# Patient Record
Sex: Female | Born: 1950 | Race: White | Hispanic: No | Marital: Married | Smoking: Never smoker
Health system: Southern US, Community
[De-identification: ages and names within clinical notes are randomized; demographics above are authoritative.]

## PROBLEM LIST (undated history)

## (undated) DIAGNOSIS — D580 Hereditary spherocytosis: Secondary | ICD-10-CM

## (undated) DIAGNOSIS — M8580 Other specified disorders of bone density and structure, unspecified site: Secondary | ICD-10-CM

## (undated) DIAGNOSIS — R0789 Other chest pain: Secondary | ICD-10-CM

## (undated) DIAGNOSIS — E042 Nontoxic multinodular goiter: Secondary | ICD-10-CM

## (undated) HISTORY — PX: TUBAL LIGATION: SHX77

## (undated) HISTORY — PX: SPLENECTOMY: SUR1306

## (undated) HISTORY — PX: OTHER SURGICAL HISTORY: SHX169

---

## 2005-03-17 ENCOUNTER — Ambulatory Visit: Payer: Self-pay | Admitting: Family Medicine

## 2006-03-21 ENCOUNTER — Ambulatory Visit: Payer: Self-pay | Admitting: Family Medicine

## 2006-08-18 ENCOUNTER — Ambulatory Visit: Payer: Self-pay | Admitting: Internal Medicine

## 2006-09-01 ENCOUNTER — Ambulatory Visit: Payer: Self-pay | Admitting: Internal Medicine

## 2006-09-18 ENCOUNTER — Ambulatory Visit: Payer: Self-pay | Admitting: Family Medicine

## 2006-09-30 ENCOUNTER — Ambulatory Visit: Payer: Self-pay | Admitting: Internal Medicine

## 2006-10-17 ENCOUNTER — Ambulatory Visit: Payer: Self-pay | Admitting: General Surgery

## 2006-11-30 ENCOUNTER — Ambulatory Visit: Payer: Self-pay | Admitting: Internal Medicine

## 2006-12-31 ENCOUNTER — Ambulatory Visit: Payer: Self-pay | Admitting: Internal Medicine

## 2007-03-02 ENCOUNTER — Ambulatory Visit: Payer: Self-pay | Admitting: Internal Medicine

## 2007-03-08 ENCOUNTER — Ambulatory Visit: Payer: Self-pay | Admitting: Internal Medicine

## 2007-03-23 ENCOUNTER — Emergency Department: Payer: Self-pay | Admitting: Unknown Physician Specialty

## 2007-03-24 ENCOUNTER — Other Ambulatory Visit: Payer: Self-pay

## 2007-03-26 ENCOUNTER — Ambulatory Visit: Payer: Self-pay | Admitting: Family Medicine

## 2007-04-02 ENCOUNTER — Ambulatory Visit: Payer: Self-pay | Admitting: Internal Medicine

## 2007-09-13 ENCOUNTER — Ambulatory Visit: Payer: Self-pay | Admitting: Internal Medicine

## 2007-09-30 ENCOUNTER — Ambulatory Visit: Payer: Self-pay | Admitting: Internal Medicine

## 2008-03-01 ENCOUNTER — Ambulatory Visit: Payer: Self-pay | Admitting: Internal Medicine

## 2008-03-12 ENCOUNTER — Ambulatory Visit: Payer: Self-pay | Admitting: Internal Medicine

## 2008-03-26 ENCOUNTER — Ambulatory Visit: Payer: Self-pay | Admitting: Family Medicine

## 2008-04-01 ENCOUNTER — Ambulatory Visit: Payer: Self-pay | Admitting: Internal Medicine

## 2008-04-23 ENCOUNTER — Ambulatory Visit: Payer: Self-pay | Admitting: Unknown Physician Specialty

## 2009-03-01 ENCOUNTER — Ambulatory Visit: Payer: Self-pay | Admitting: Internal Medicine

## 2009-03-10 ENCOUNTER — Ambulatory Visit: Payer: Self-pay | Admitting: Internal Medicine

## 2009-03-31 ENCOUNTER — Ambulatory Visit: Payer: Self-pay | Admitting: Family Medicine

## 2009-04-01 ENCOUNTER — Ambulatory Visit: Payer: Self-pay | Admitting: Internal Medicine

## 2009-04-10 ENCOUNTER — Ambulatory Visit: Payer: Self-pay | Admitting: Family Medicine

## 2010-03-01 ENCOUNTER — Ambulatory Visit: Payer: Self-pay | Admitting: Internal Medicine

## 2010-03-05 ENCOUNTER — Ambulatory Visit: Payer: Self-pay | Admitting: Internal Medicine

## 2010-04-01 ENCOUNTER — Ambulatory Visit: Payer: Self-pay | Admitting: Internal Medicine

## 2010-04-06 ENCOUNTER — Ambulatory Visit: Payer: Self-pay | Admitting: Family Medicine

## 2011-03-02 ENCOUNTER — Ambulatory Visit: Payer: Self-pay | Admitting: Internal Medicine

## 2011-04-02 ENCOUNTER — Ambulatory Visit: Payer: Self-pay | Admitting: Internal Medicine

## 2011-05-11 ENCOUNTER — Ambulatory Visit: Payer: Self-pay | Admitting: Family Medicine

## 2011-05-13 ENCOUNTER — Ambulatory Visit: Payer: Self-pay | Admitting: Family Medicine

## 2012-05-14 ENCOUNTER — Ambulatory Visit: Payer: Self-pay | Admitting: Family Medicine

## 2013-05-15 ENCOUNTER — Ambulatory Visit: Payer: Self-pay | Admitting: Family Medicine

## 2014-05-19 ENCOUNTER — Ambulatory Visit: Payer: Self-pay | Admitting: Family Medicine

## 2014-11-04 ENCOUNTER — Emergency Department
Admit: 2014-11-04 | Disposition: A | Discharge status: Home / Self Care | Payer: Self-pay | Admitting: Emergency Medicine

## 2015-04-17 ENCOUNTER — Other Ambulatory Visit: Payer: Self-pay | Admitting: Family Medicine

## 2015-04-17 DIAGNOSIS — Z1231 Encounter for screening mammogram for malignant neoplasm of breast: Secondary | ICD-10-CM

## 2015-05-21 ENCOUNTER — Ambulatory Visit
Admission: RE | Admit: 2015-05-21 | Discharge: 2015-05-21 | Disposition: A | Discharge status: Home / Self Care | Payer: 59 | Source: Ambulatory Visit | Attending: Family Medicine | Admitting: Family Medicine

## 2015-05-21 DIAGNOSIS — Z1231 Encounter for screening mammogram for malignant neoplasm of breast: Secondary | ICD-10-CM | POA: Diagnosis not present

## 2016-04-19 ENCOUNTER — Other Ambulatory Visit: Payer: Self-pay | Admitting: Family Medicine

## 2016-04-19 DIAGNOSIS — Z1231 Encounter for screening mammogram for malignant neoplasm of breast: Secondary | ICD-10-CM

## 2016-05-25 ENCOUNTER — Ambulatory Visit
Admission: RE | Admit: 2016-05-25 | Discharge: 2016-05-25 | Disposition: A | Discharge status: Home / Self Care | Payer: 59 | Source: Ambulatory Visit | Attending: Family Medicine | Admitting: Family Medicine

## 2016-05-25 DIAGNOSIS — Z1231 Encounter for screening mammogram for malignant neoplasm of breast: Secondary | ICD-10-CM | POA: Diagnosis present

## 2017-04-21 ENCOUNTER — Other Ambulatory Visit: Payer: Self-pay | Admitting: Family Medicine

## 2017-04-21 DIAGNOSIS — Z1231 Encounter for screening mammogram for malignant neoplasm of breast: Secondary | ICD-10-CM

## 2017-05-30 ENCOUNTER — Ambulatory Visit
Admission: RE | Admit: 2017-05-30 | Discharge: 2017-05-30 | Disposition: A | Discharge status: Home / Self Care | Payer: Medicare HMO | Source: Ambulatory Visit | Attending: Family Medicine | Admitting: Family Medicine

## 2017-05-30 DIAGNOSIS — Z1231 Encounter for screening mammogram for malignant neoplasm of breast: Secondary | ICD-10-CM

## 2017-08-05 ENCOUNTER — Other Ambulatory Visit: Payer: Self-pay

## 2017-08-05 ENCOUNTER — Emergency Department
Admission: EM | Admit: 2017-08-05 | Discharge: 2017-08-05 | Disposition: A | Discharge status: Home / Self Care | Payer: Medicare HMO | Attending: Emergency Medicine | Admitting: Emergency Medicine

## 2017-08-05 ENCOUNTER — Emergency Department: Payer: Medicare HMO

## 2017-08-05 DIAGNOSIS — R079 Chest pain, unspecified: Secondary | ICD-10-CM | POA: Diagnosis present

## 2017-08-05 LAB — BASIC METABOLIC PANEL
Anion gap: 9 (ref 5–15)
BUN: 12 mg/dL (ref 6–20)
CALCIUM: 9.8 mg/dL (ref 8.9–10.3)
CHLORIDE: 107 mmol/L (ref 101–111)
CO2: 23 mmol/L (ref 22–32)
CREATININE: 0.66 mg/dL (ref 0.44–1.00)
Glucose, Bld: 106 mg/dL — ABNORMAL HIGH (ref 65–99)
Potassium: 3.8 mmol/L (ref 3.5–5.1)
SODIUM: 139 mmol/L (ref 135–145)

## 2017-08-05 LAB — CBC
HCT: 43 % (ref 35.0–47.0)
Hemoglobin: 15.2 g/dL (ref 12.0–16.0)
MCH: 32 pg (ref 26.0–34.0)
MCHC: 35.4 g/dL (ref 32.0–36.0)
MCV: 90.4 fL (ref 80.0–100.0)
PLATELETS: 411 10*3/uL (ref 150–440)
RBC: 4.75 MIL/uL (ref 3.80–5.20)
RDW: 13.2 % (ref 11.5–14.5)
WBC: 13.6 10*3/uL — AB (ref 3.6–11.0)

## 2017-08-05 LAB — TROPONIN I

## 2017-08-05 NOTE — Discharge Instructions (Signed)
You are evaluated for central chest discomfort, and although no certain cause was found, your exam and evaluation are overall reassuring in the emergency department today.  Return to the emergency room immediately for any new or worsening chest pain, certainly any nausea, sweats, vomiting, trouble breathing, pain into the neck/jaw or arms or back, dizziness or passing out, or any other symptoms concerning to you.  I am most suspicious of acid reflux/indigestion as we discussed, you may use Tums or Maalox over-the-counter to help neutralize acid, use as directed on labeling.  Try over-the-counter Prilosec 40 mg daily for 10-14 days to help reduce acid while your stomach lining is healing.

## 2017-08-05 NOTE — ED Triage Notes (Signed)
Pt reports that she developed indigestion at 0130, she took a Rolaid  It did not help, she also took Peptobismol and 81 mg of ASA. She did not feel better and called 911. EMS did a EKG and took two more baby ASA. She went to sleep and felt better she went to walk in clinic to get blood work and they sent her here.

## 2017-08-05 NOTE — ED Notes (Signed)
NAD noted at time of D/C. Pt denies questions or concerns. Pt ambulatory to the lobby at this time.  

## 2017-08-05 NOTE — ED Provider Notes (Signed)
[facility] Emergency Department Provider Note ____________________________________________   I have reviewed the triage vital signs and the triage nursing note.  HISTORY  Chief Complaint Chest Pain   Historian Patient  HPI Regina Melton is a 67 y.o. female with no significant contributing past medical history, presents for chest pain, central chest and lower chest that woke her up around 1:30 AM.  She states that she thought it might be indigestion and took Rolaids although she typically would take Tums.  She states she is only had indigestion a couple of times before.  No known coronary artery disease or cardiac history.  She is under a lot of stress right now because her 14 year old mother has been ill and the family has been staying overnight as well as during the day recently.  She did have an episode with some cold sweat when she started to worry about whether or not this could be something more serious.  She took a baby aspirin.  She did ultimately end up calling EMS on the request of her husband but it sounds like they told her that her EKG was normal and that she could decide whether or not to come to the ER now at that time, or wait till morning go to urgent care or the ER.  She chose to wait and go to urgent care in the morning and when she got there they told her to go to the ER for further evaluation.  She drove here herself.   No past medical history on file.  There are no active problems to display for this patient.   No past surgical history on file.  Prior to Admission medications   Not on File    No Known Allergies  Family History  Problem Relation Age of Onset  . Breast cancer Neg Hx     Social History Social History   Tobacco Use  . Smoking status: Not on file  Substance Use Topics  . Alcohol use: Not on file  . Drug use: Not on file    Review of Systems  Constitutional: Negative for fever. Eyes: Negative for  visual changes. ENT: Negative for sore throat. Cardiovascular: Positive for chest pain episode which was more significant overnight, currently mild soreness or tightness. Respiratory: Negative for shortness of breath. Gastrointestinal: Negative for abdominal pain, vomiting and diarrhea. Genitourinary: Negative for dysuria. Musculoskeletal: Negative for back pain. Skin: Negative for rash. Neurological: Negative for headache.  ____________________________________________   PHYSICAL EXAM:  VITAL SIGNS: ED Triage Vitals  Enc Vitals Group     BP 08/05/17 1003 (!) 154/82     Pulse Rate 08/05/17 1003 81     Resp 08/05/17 1003 18     Temp 08/05/17 1003 98.1 F (36.7 C)     Temp Source 08/05/17 1003 Oral     SpO2 08/05/17 1003 100 %     Weight 08/05/17 1001 140 lb (63.5 kg)     Height 08/05/17 1001 5' 2.5" (1.588 m)     Head Circumference --      Peak Flow --      Pain Score 08/05/17 1000 0     Pain Loc --      Pain Edu? --      Excl. in [place]? --      Constitutional: Alert and oriented. Well appearing and in no distress. HEENT   Head: Normocephalic and atraumatic.      Eyes: Conjunctivae are normal. Pupils equal and round.  Ears:         Nose: No congestion/rhinnorhea.   Mouth/Throat: Mucous membranes are moist.   Neck: No stridor. Cardiovascular/Chest: Normal rate, regular rhythm.  No murmurs, rubs, or gallops. Respiratory: Normal respiratory effort without tachypnea nor retractions. Breath sounds are clear and equal bilaterally. No wheezes/rales/rhonchi. Gastrointestinal: Soft. No distention, no guarding, no rebound. Nontender.    Genitourinary/rectal:Deferred Musculoskeletal: Nontender with normal range of motion in all extremities. No joint effusions.  No lower extremity tenderness.  No edema. Neurologic:  Normal speech and language. No gross or focal neurologic deficits are appreciated. Skin:  Skin is warm, dry and intact. No rash noted. Psychiatric: Mood  and affect are normal. Speech and behavior are normal. Patient exhibits appropriate insight and judgment.   ____________________________________________  LABS (pertinent positives/negatives) I, Lisa Roca, MD the attending physician have reviewed the labs noted below.  Labs Reviewed  BASIC METABOLIC PANEL - Abnormal; Notable for the following components:      Result Value   Glucose, Bld 106 (*)    All other components within normal limits  CBC - Abnormal; Notable for the following components:   WBC 13.6 (*)    All other components within normal limits  TROPONIN I    ____________________________________________    EKG I, Lisa Roca, MD, the attending physician have personally viewed and interpreted all ECGs.  89 bpm.  Normal sinus rhythm.  Narrow QRS.  Normal axis.  Normal ST and T wave ____________________________________________  RADIOLOGY All Xrays were viewed by me.  Imaging interpreted by Radiologist, and I, Lisa Roca, MD the attending physician have reviewed the radiologist interpretation noted below.  Chest x-ray two-view:  IMPRESSION: No evidence of acute cardiopulmonary disease.  __________________________________________  PROCEDURES  Procedure(s) performed: None  Critical Care performed: None   ____________________________________________  ED COURSE / ASSESSMENT AND PLAN  Pertinent labs & imaging results that were available during my care of the patient were reviewed by me and considered in my medical decision making (see chart for details).    Patient describes central chest discomfort at one point with some sweats, but no other associated symptoms which is currently now essentially resolved.  Pain at onset at 130, and patient finally ended up over here and EKG is normal in appearance, troponin is normal.  Symptoms were well over 4 hours ago, and not persistent, I do not think she needs an additional troponin recheck today.  Symptoms seem most  likely clinically consistent with GERD/gastritis/indigestion.  We discussed symptomatic treatment for that.  I am asking her to follow closely with a cardiologist for consideration of stress testing.  We discussed return precautions with respect to any worsening or changing condition.  DIFFERENTIAL DIAGNOSIS: Differential diagnosis includes, but is not limited to, ACS, aortic dissection, pulmonary embolism, cardiac tamponade, pneumothorax, pneumonia, pericarditis, myocarditis, GI-related causes including esophagitis/gastritis, and musculoskeletal chest wall pain.    CONSULTATIONS:  None   Patient / Family / Caregiver informed of clinical course, medical decision-making process, and agree with plan.   I discussed return precautions, follow-up instructions, and discharge instructions with patient and/or family.  Discharge Instructions :  You are evaluated for central chest discomfort, and although no certain cause was found, your exam and evaluation are overall reassuring in the emergency department today.  Return to the emergency room immediately for any new or worsening chest pain, certainly any nausea, sweats, vomiting, trouble breathing, pain into the neck/jaw or arms or back, dizziness or passing out, or any other symptoms concerning  to you.  I am most suspicious of acid reflux/indigestion as we discussed, you may use Tums or Maalox over-the-counter to help neutralize acid, use as directed on labeling.  Try over-the-counter Prilosec 40 mg daily for 10-14 days to help reduce acid while your stomach lining is healing.      ___________________________________________   FINAL CLINICAL IMPRESSION(S) / ED DIAGNOSES   Final diagnoses:  Nonspecific chest pain      ___________________________________________        Note: This dictation was prepared with Dragon dictation. Any transcriptional errors that result from this process are unintentional    Lisa Roca, MD 08/05/17  1105

## 2018-04-25 ENCOUNTER — Other Ambulatory Visit: Payer: Self-pay | Admitting: Family Medicine

## 2018-04-25 DIAGNOSIS — Z1231 Encounter for screening mammogram for malignant neoplasm of breast: Secondary | ICD-10-CM

## 2018-05-31 ENCOUNTER — Ambulatory Visit
Admission: RE | Admit: 2018-05-31 | Discharge: 2018-05-31 | Disposition: A | Discharge status: Home / Self Care | Payer: Medicare HMO | Source: Ambulatory Visit | Attending: Family Medicine | Admitting: Family Medicine

## 2018-05-31 DIAGNOSIS — Z1231 Encounter for screening mammogram for malignant neoplasm of breast: Secondary | ICD-10-CM | POA: Insufficient documentation

## 2018-06-04 ENCOUNTER — Other Ambulatory Visit: Payer: Self-pay | Admitting: Family Medicine

## 2018-06-04 DIAGNOSIS — R928 Other abnormal and inconclusive findings on diagnostic imaging of breast: Secondary | ICD-10-CM

## 2018-06-04 DIAGNOSIS — N6310 Unspecified lump in the right breast, unspecified quadrant: Secondary | ICD-10-CM

## 2018-06-14 ENCOUNTER — Ambulatory Visit
Admission: RE | Admit: 2018-06-14 | Discharge: 2018-06-14 | Disposition: A | Discharge status: Home / Self Care | Payer: Medicare HMO | Source: Ambulatory Visit | Attending: Family Medicine | Admitting: Family Medicine

## 2018-06-14 DIAGNOSIS — R928 Other abnormal and inconclusive findings on diagnostic imaging of breast: Secondary | ICD-10-CM

## 2018-06-14 DIAGNOSIS — N6310 Unspecified lump in the right breast, unspecified quadrant: Secondary | ICD-10-CM | POA: Insufficient documentation

## 2018-07-23 ENCOUNTER — Encounter: Payer: Self-pay | Admitting: *Deleted

## 2018-07-27 ENCOUNTER — Ambulatory Visit: Payer: Medicare HMO | Admitting: Anesthesiology

## 2018-07-27 ENCOUNTER — Other Ambulatory Visit: Payer: Self-pay

## 2018-07-27 ENCOUNTER — Encounter
Admission: RE | Disposition: A | Discharge status: Home / Self Care | Payer: Self-pay | Source: Ambulatory Visit | Attending: Unknown Physician Specialty

## 2018-07-27 ENCOUNTER — Ambulatory Visit
Admission: RE | Admit: 2018-07-27 | Discharge: 2018-07-27 | Disposition: A | Discharge status: Home / Self Care | Payer: Medicare HMO | Source: Ambulatory Visit | Attending: Unknown Physician Specialty | Admitting: Unknown Physician Specialty

## 2018-07-27 ENCOUNTER — Encounter: Payer: Self-pay | Admitting: Anesthesiology

## 2018-07-27 DIAGNOSIS — K640 First degree hemorrhoids: Secondary | ICD-10-CM | POA: Diagnosis not present

## 2018-07-27 DIAGNOSIS — D580 Hereditary spherocytosis: Secondary | ICD-10-CM | POA: Insufficient documentation

## 2018-07-27 DIAGNOSIS — Z79899 Other long term (current) drug therapy: Secondary | ICD-10-CM | POA: Diagnosis not present

## 2018-07-27 DIAGNOSIS — M8580 Other specified disorders of bone density and structure, unspecified site: Secondary | ICD-10-CM | POA: Diagnosis not present

## 2018-07-27 DIAGNOSIS — R195 Other fecal abnormalities: Secondary | ICD-10-CM | POA: Diagnosis not present

## 2018-07-27 DIAGNOSIS — D123 Benign neoplasm of transverse colon: Secondary | ICD-10-CM | POA: Insufficient documentation

## 2018-07-27 HISTORY — DX: Nontoxic multinodular goiter: E04.2

## 2018-07-27 HISTORY — DX: Other chest pain: R07.89

## 2018-07-27 HISTORY — DX: Hereditary spherocytosis: D58.0

## 2018-07-27 HISTORY — PX: COLONOSCOPY WITH PROPOFOL: SHX5780

## 2018-07-27 HISTORY — DX: Other specified disorders of bone density and structure, unspecified site: M85.80

## 2018-07-27 SURGERY — COLONOSCOPY WITH PROPOFOL
Anesthesia: General

## 2018-07-27 MED ORDER — PROPOFOL 500 MG/50ML IV EMUL
INTRAVENOUS | Status: AC
  Filled 2018-07-27: qty 50

## 2018-07-27 MED ORDER — LIDOCAINE HCL (PF) 2 % IJ SOLN
INTRAMUSCULAR | Status: AC
  Filled 2018-07-27: qty 10

## 2018-07-27 MED ORDER — PROPOFOL 500 MG/50ML IV EMUL
INTRAVENOUS | Status: DC | PRN
  Administered 2018-07-27: 175 ug/kg/min via INTRAVENOUS

## 2018-07-27 MED ORDER — SODIUM CHLORIDE 0.9 % IV SOLN
INTRAVENOUS | Status: DC
  Administered 2018-07-27: 10:00:00 via INTRAVENOUS

## 2018-07-27 MED ORDER — LIDOCAINE HCL (CARDIAC) PF 100 MG/5ML IV SOSY
PREFILLED_SYRINGE | INTRAVENOUS | Status: DC | PRN
  Administered 2018-07-27: 50 mg via INTRAVENOUS

## 2018-07-27 MED ORDER — SODIUM CHLORIDE 0.9 % IV SOLN: INTRAVENOUS | Status: DC

## 2018-07-27 MED ORDER — PROPOFOL 10 MG/ML IV BOLUS
INTRAVENOUS | Status: DC | PRN
  Administered 2018-07-27: 40 mg via INTRAVENOUS
  Administered 2018-07-27: 50 mg via INTRAVENOUS

## 2018-07-27 NOTE — Anesthesia Postprocedure Evaluation (Signed)
Anesthesia Post Note  Patient: Regina Melton  Procedure(s) Performed: COLONOSCOPY WITH PROPOFOL (N/A )  Patient location during evaluation: Endoscopy Anesthesia Type: General Level of consciousness: awake and alert Pain management: pain level controlled Vital Signs Assessment: post-procedure vital signs reviewed and stable Respiratory status: spontaneous breathing, nonlabored ventilation, respiratory function stable and patient connected to nasal cannula oxygen Cardiovascular status: blood pressure returned to baseline and stable Postop Assessment: no apparent nausea or vomiting Anesthetic complications: no     Last Vitals:  Vitals:   07/27/18 1220 07/27/18 1222  BP: 104/86 104/86  Pulse: 64 65  Resp: 16 (!) 21  Temp:    SpO2: 100% 100%    Last Pain:  Vitals:   07/27/18 1222  TempSrc:   PainSc: 0-No pain                 Abiola Behring S

## 2018-07-27 NOTE — Op Note (Signed)
[facility]astroenterology Patient Name: Regina Melton Procedure Date: 07/27/2018 11:22 AM MRN: [id number] Account #: [id number] Date of Birth: 09-04-1950 Admit Type: Outpatient Age: 67 Room: [facility] ENDO ROOM 3 Gender: Female Note Status: Finalized Procedure:            Colonoscopy Indications:          Heme positive stool Providers:            Manya Silvas, MD Referring MD:         Irven Easterly. Kary Kos, MD (Referring MD) Medicines:            Propofol per Anesthesia Complications:        No immediate complications. Procedure:            Pre-Anesthesia Assessment:                       - After reviewing the risks and benefits, the patient                        was deemed in satisfactory condition to undergo the                        procedure.                       After obtaining informed consent, the colonoscope was                        passed under direct vision. Throughout the procedure,                        the patient's blood pressure, pulse, and oxygen                        saturations were monitored continuously. The                        Colonoscope was introduced through the anus and                        advanced to the the cecum, identified by appendiceal                        orifice and ileocecal valve. The colonoscopy was                        performed without difficulty. The patient tolerated the                        procedure well. The quality of the bowel preparation                        was excellent. Findings:      A diminutive polyp was found in the transverse colon. The polyp was       sessile. The polyp was removed with a jumbo cold forceps. Resection and       retrieval were complete.      Internal hemorrhoids were found during endoscopy. The hemorrhoids were       small and Grade I (internal hemorrhoids that do not prolapse).      The exam was otherwise  without abnormality. Impression:           - One diminutive polyp  in the transverse colon, removed                        with a jumbo cold forceps. Resected and retrieved.                       - Internal hemorrhoids.                       - The examination was otherwise normal. Recommendation:       - Await pathology results. Manya Silvas, MD 07/27/2018 12:00:25 PM This report has been signed electronically. Number of Addenda: 0 Note Initiated On: 07/27/2018 11:22 AM Scope Withdrawal Time: 0 hours 7 minutes 30 seconds  Total Procedure Duration: 0 hours 14 minutes 56 seconds       [facility]

## 2018-07-27 NOTE — H&P (Signed)
Primary Care Physician:  [place] Pink, MD Primary Gastroenterologist:  Dr. Vira Agar  Pre-Procedure History & Physical: HPI:  Regina Melton is a 67 y.o. female is here for an colonoscopy.   Past Medical History:  Diagnosis Date  . Atypical chest pain   . Multinodular goiter   . Osteopenia   . Spherocytosis (familial) [facility])     Past Surgical History:  Procedure Laterality Date  . SPLENECTOMY     AGE 9  . THYROID NODULE BIOPSY Right   . TUBAL LIGATION      Prior to Admission medications   Medication Sig Start Date End Date Taking? Authorizing Provider  calcium-vitamin D (OSCAL WITH D) 500-200 MG-UNIT tablet Take 1 tablet by mouth 2 (two) times daily.   Yes [provider]  omeprazole (PRILOSEC) 20 MG capsule Take 20 mg by mouth 2 (two) times daily before a meal.   Yes [provider]  vitamin B-12 (CYANOCOBALAMIN) 500 MCG tablet Take 500 mcg by mouth daily.   Yes [provider]    Allergies as of 05/17/2018  . (No Known Allergies)    Family History  Problem Relation Age of Onset  . Breast cancer Neg Hx     Social History   Socioeconomic History  . Marital status: Married    Spouse name: Not on file  . Number of children: Not on file  . Years of education: Not on file  . Highest education level: Not on file  Occupational History  . Not on file  Social Needs  . Financial resource strain: Not on file  . Food insecurity:    Worry: Not on file    Inability: Not on file  . Transportation needs:    Medical: Not on file    Non-medical: Not on file  Tobacco Use  . Smoking status: Never Smoker  . Smokeless tobacco: Never Used  Substance and Sexual Activity  . Alcohol use: Never    Frequency: Never  . Drug use: Never  . Sexual activity: Not on file  Lifestyle  . Physical activity:    Days per week: Not on file    Minutes per session: Not on file  . Stress: Not on file  Relationships  . Social connections:    Talks on phone:  Not on file    Gets together: Not on file    Attends religious service: Not on file    Active member of club or organization: Not on file    Attends meetings of clubs or organizations: Not on file    Relationship status: Not on file  . Intimate partner violence:    Fear of current or ex partner: Not on file    Emotionally abused: Not on file    Physically abused: Not on file    Forced sexual activity: Not on file  Other Topics Concern  . Not on file  Social History Narrative  . Not on file    Review of Systems: See HPI, otherwise negative ROS  Physical Exam: BP (!) 145/82   Pulse (!) 16   Temp (!) 96.8 F (36 C) (Tympanic)   Resp 16   Ht 5\' 1"  (1.549 m)   Wt 59.9 kg   SpO2 100%   BMI 24.94 kg/m  General:   Alert,  pleasant and cooperative in NAD Head:  Normocephalic and atraumatic. Neck:  Supple; no masses or thyromegaly. Lungs:  Clear throughout to auscultation.    Heart:  Regular rate and  rhythm. Abdomen:  Soft, nontender and nondistended. Normal bowel sounds, without guarding, and without rebound.   Neurologic:  Alert and  oriented x4;  grossly normal neurologically.  Impression/Plan: Regina Melton is here for an colonoscopy to be performed for heme positive stool.  Risks, benefits, limitations, and alternatives regarding  colonoscopy have been reviewed with the patient.  Questions have been answered.  All parties agreeable.   Gaylyn Cheers, MD  07/27/2018, 11:27 AM

## 2018-07-27 NOTE — Anesthesia Post-op Follow-up Note (Signed)
Anesthesia QCDR form completed.        

## 2018-07-27 NOTE — Transfer of Care (Signed)
Immediate Anesthesia Transfer of Care Note  Patient: Regina Melton  Procedure(s) Performed: COLONOSCOPY WITH PROPOFOL (N/A )  Patient Location: PACU  Anesthesia Type:General  Level of Consciousness: awake  Airway & Oxygen Therapy: Patient Spontanous Breathing and Patient connected to nasal cannula oxygen  Post-op Assessment: Report given to RN and Post -op Vital signs reviewed and stable  Post vital signs: Reviewed and stable  Last Vitals:  Vitals Value Taken Time  BP 99/55 07/27/2018 11:52 AM  Temp 36.1 C 07/27/2018 11:51 AM  Pulse 82 07/27/2018 11:52 AM  Resp 21 07/27/2018 11:52 AM  SpO2 98 % 07/27/2018 11:52 AM  Vitals shown include unvalidated device data.  Last Pain:  Vitals:   07/27/18 1151  TempSrc: Tympanic  PainSc: 0-No pain         Complications: No apparent anesthesia complications

## 2018-07-27 NOTE — Anesthesia Preprocedure Evaluation (Signed)
Anesthesia Evaluation  Patient identified by MRN, date of birth, ID band Patient awake    Reviewed: Allergy & Precautions, NPO status , Patient's Chart, lab work & pertinent test results, reviewed documented beta blocker date and time   Airway Mallampati: II  TM Distance: >3 FB     Dental  (+) Chipped   Pulmonary           Cardiovascular      Neuro/Psych    GI/Hepatic   Endo/Other    Renal/GU      Musculoskeletal   Abdominal   Peds  Hematology  (+) anemia ,   Anesthesia Other Findings EKG ok.  Reproductive/Obstetrics                             Anesthesia Physical Anesthesia Plan  ASA: II  Anesthesia Plan: General   Post-op Pain Management:    Induction: Intravenous  PONV Risk Score and Plan:   Airway Management Planned:   Additional Equipment:   Intra-op Plan:   Post-operative Plan:   Informed Consent: I have reviewed the patients History and Physical, chart, labs and discussed the procedure including the risks, benefits and alternatives for the proposed anesthesia with the patient or authorized representative who has indicated his/her understanding and acceptance.     Plan Discussed with: CRNA  Anesthesia Plan Comments:         Anesthesia Quick Evaluation

## 2018-07-28 NOTE — Progress Notes (Signed)
Voicemail. No message left.

## 2018-07-30 ENCOUNTER — Encounter: Payer: Self-pay | Admitting: Unknown Physician Specialty

## 2018-07-30 LAB — SURGICAL PATHOLOGY

## 2018-10-31 IMAGING — CR DG CHEST 2V
1 series · 2 of 2 positions shown · non-contrast
Comparison: 11/04/2014

CLINICAL DATA: Chest pain

EXAM:
CHEST  2 VIEW

[Series 1: dg chest 2 view · 0.14mm/px · 2 of 2 slices shown]
[im 1/2]
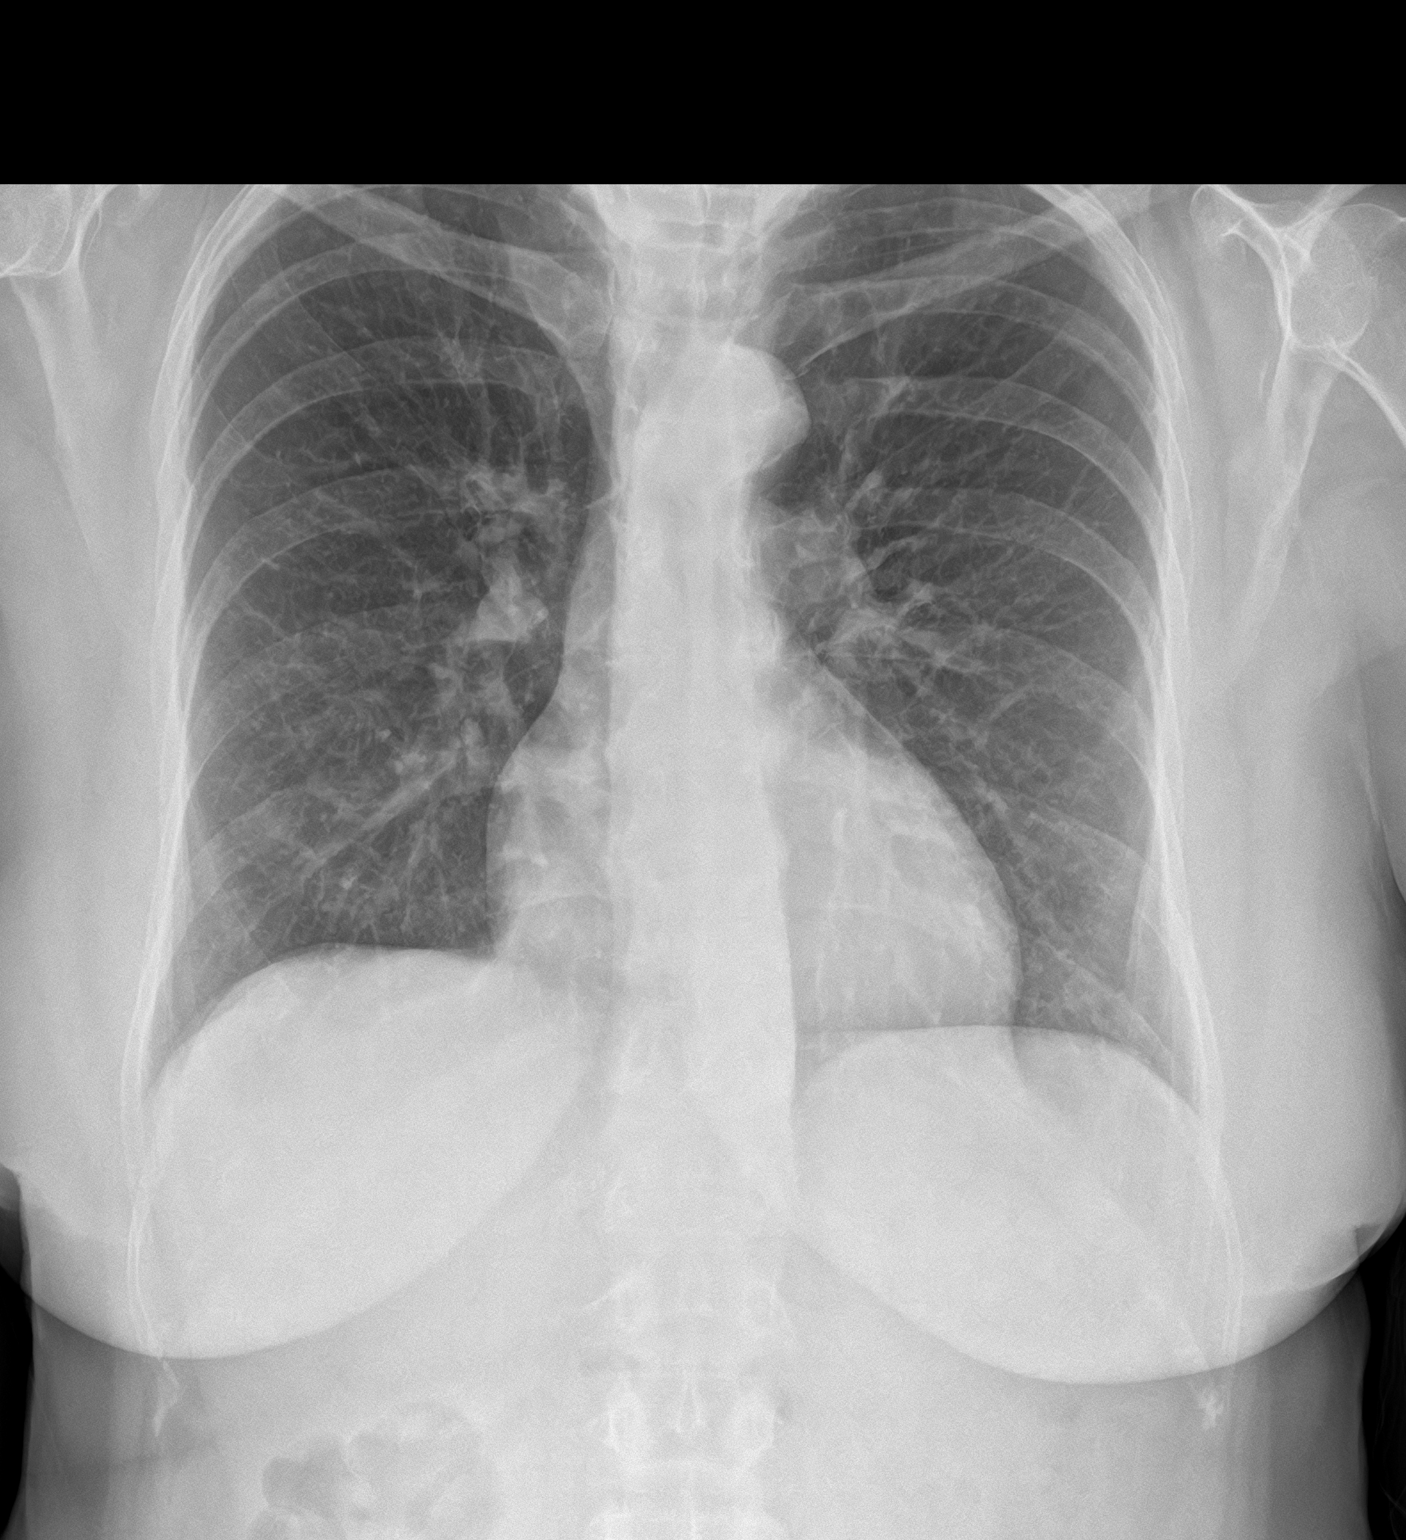
[im 2/2]
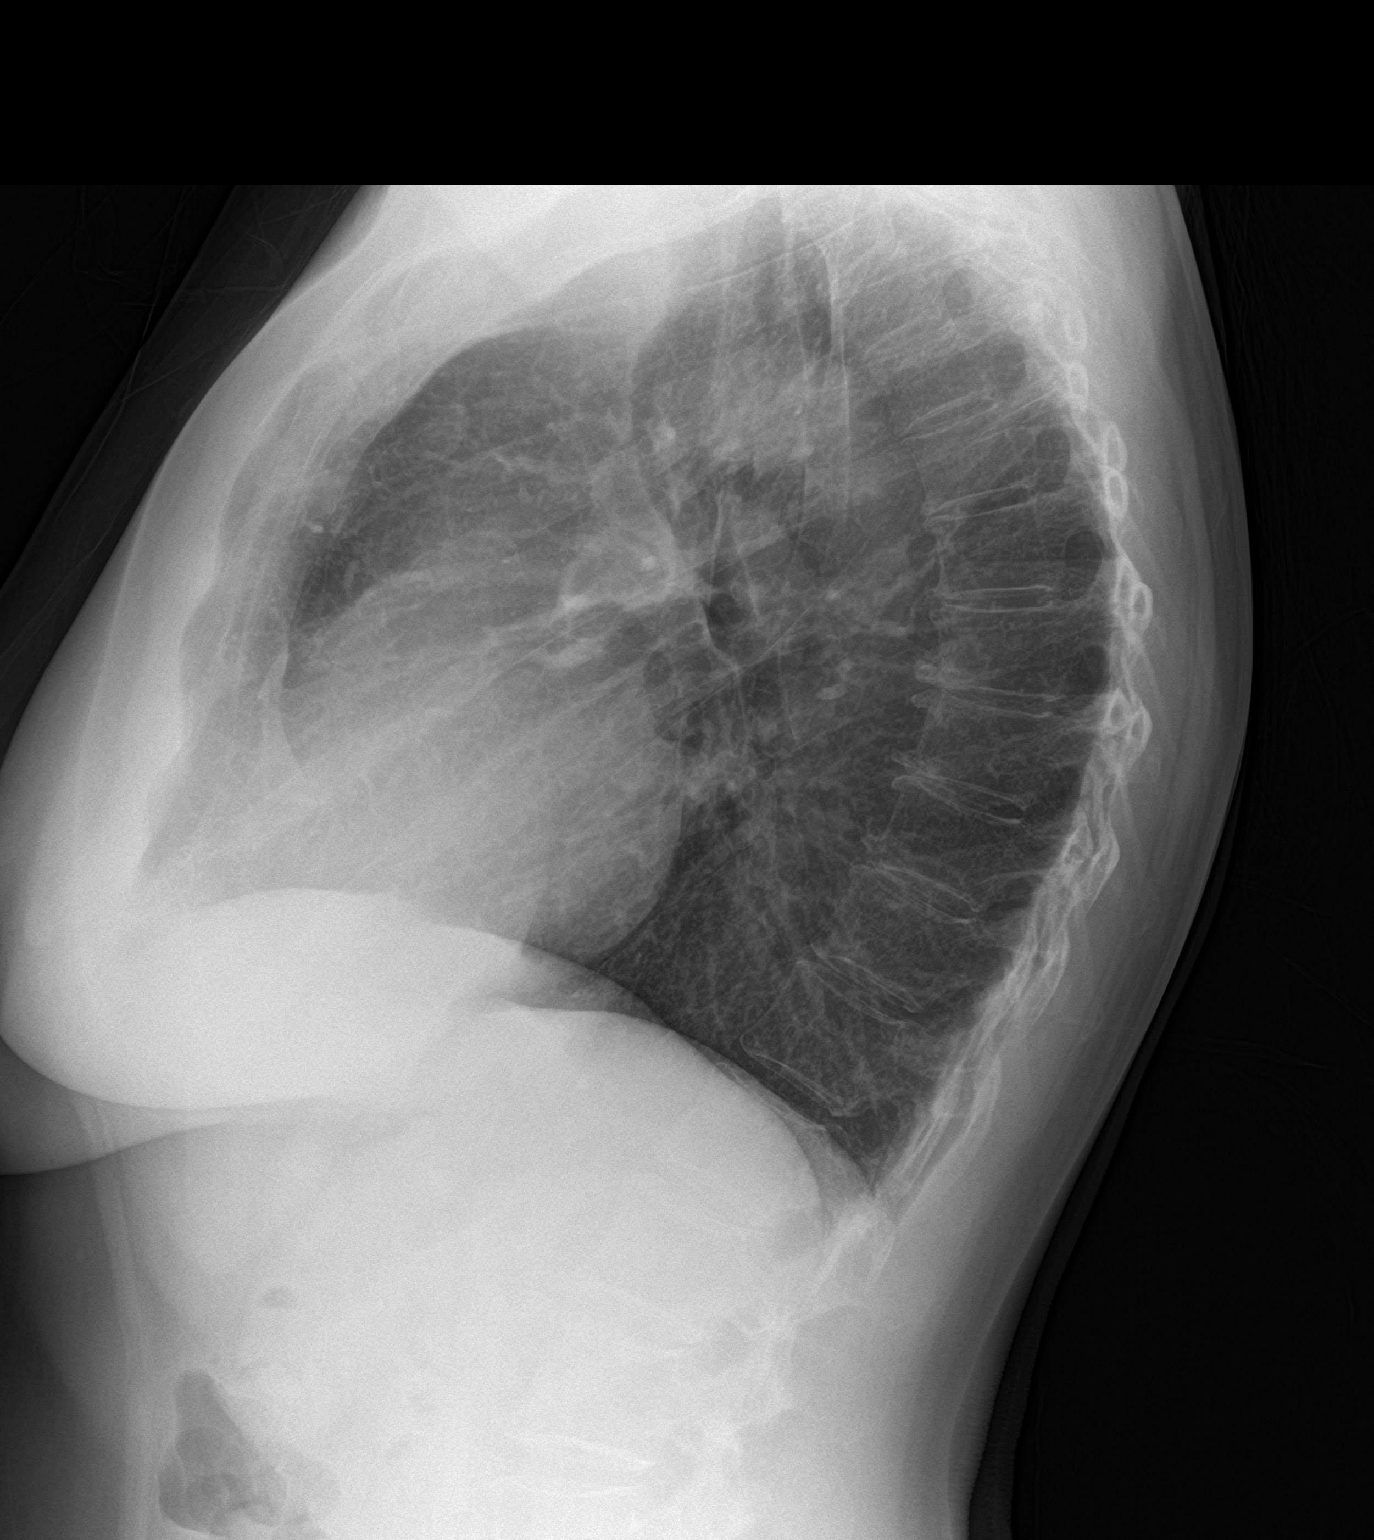

[2 of 2 positions shown; findings below may reference images not displayed]

FINDINGS: Lungs are clear.  No pleural effusion or pneumothorax.

The heart is normal in size.

Mild degenerative changes of the visualized thoracolumbar spine.
IMPRESSION: No evidence of acute cardiopulmonary disease.

## 2019-04-26 ENCOUNTER — Other Ambulatory Visit: Payer: Self-pay | Admitting: Family Medicine

## 2019-04-26 DIAGNOSIS — Z1231 Encounter for screening mammogram for malignant neoplasm of breast: Secondary | ICD-10-CM

## 2019-06-06 ENCOUNTER — Ambulatory Visit
Admission: RE | Admit: 2019-06-06 | Discharge: 2019-06-06 | Disposition: A | Discharge status: Home / Self Care | Payer: Medicare HMO | Source: Ambulatory Visit | Attending: Family Medicine | Admitting: Family Medicine

## 2019-06-06 DIAGNOSIS — Z1231 Encounter for screening mammogram for malignant neoplasm of breast: Secondary | ICD-10-CM | POA: Insufficient documentation

## 2020-05-07 ENCOUNTER — Other Ambulatory Visit: Payer: Self-pay | Admitting: Family Medicine

## 2020-05-07 DIAGNOSIS — Z1231 Encounter for screening mammogram for malignant neoplasm of breast: Secondary | ICD-10-CM

## 2020-06-11 ENCOUNTER — Other Ambulatory Visit: Payer: Self-pay

## 2020-06-11 ENCOUNTER — Ambulatory Visit
Admission: RE | Admit: 2020-06-11 | Discharge: 2020-06-11 | Disposition: A | Discharge status: Home / Self Care | Payer: Medicare HMO | Source: Ambulatory Visit | Attending: Family Medicine | Admitting: Family Medicine

## 2020-06-11 DIAGNOSIS — Z1231 Encounter for screening mammogram for malignant neoplasm of breast: Secondary | ICD-10-CM | POA: Diagnosis present

## 2021-05-14 ENCOUNTER — Other Ambulatory Visit: Payer: Self-pay | Admitting: Family Medicine

## 2021-05-14 DIAGNOSIS — Z1231 Encounter for screening mammogram for malignant neoplasm of breast: Secondary | ICD-10-CM

## 2021-06-14 ENCOUNTER — Other Ambulatory Visit: Payer: Self-pay

## 2021-06-14 ENCOUNTER — Ambulatory Visit
Admission: RE | Admit: 2021-06-14 | Discharge: 2021-06-14 | Disposition: A | Discharge status: Home / Self Care | Payer: Medicare HMO | Source: Ambulatory Visit | Attending: Family Medicine | Admitting: Family Medicine

## 2021-06-14 DIAGNOSIS — Z1231 Encounter for screening mammogram for malignant neoplasm of breast: Secondary | ICD-10-CM | POA: Insufficient documentation

## 2022-05-19 ENCOUNTER — Other Ambulatory Visit: Payer: Self-pay | Admitting: Family Medicine

## 2022-05-19 DIAGNOSIS — Z1231 Encounter for screening mammogram for malignant neoplasm of breast: Secondary | ICD-10-CM

## 2022-06-27 ENCOUNTER — Ambulatory Visit
Admission: RE | Admit: 2022-06-27 | Discharge: 2022-06-27 | Disposition: A | Discharge status: Home / Self Care | Payer: Medicare HMO | Source: Ambulatory Visit | Attending: Family Medicine | Admitting: Family Medicine

## 2022-06-27 DIAGNOSIS — Z1231 Encounter for screening mammogram for malignant neoplasm of breast: Secondary | ICD-10-CM | POA: Diagnosis present

## 2022-06-29 ENCOUNTER — Other Ambulatory Visit: Payer: Self-pay | Admitting: Family Medicine

## 2022-06-29 DIAGNOSIS — Z1231 Encounter for screening mammogram for malignant neoplasm of breast: Secondary | ICD-10-CM

## 2022-09-09 IMAGING — MG MM DIGITAL SCREENING BILAT W/ TOMO AND CAD
8 series · 8 of 24 positions shown · non-contrast
Comparison: Previous exam(s).

CLINICAL DATA: Screening.

EXAM:
DIGITAL SCREENING BILATERAL MAMMOGRAM WITH TOMOSYNTHESIS AND CAD
TECHNIQUE: Bilateral screening digital craniocaudal and mediolateral oblique
mammograms were obtained. Bilateral screening digital breast
tomosynthesis was performed. The images were evaluated with
computer-aided detection.

[L MLO synth-2D]
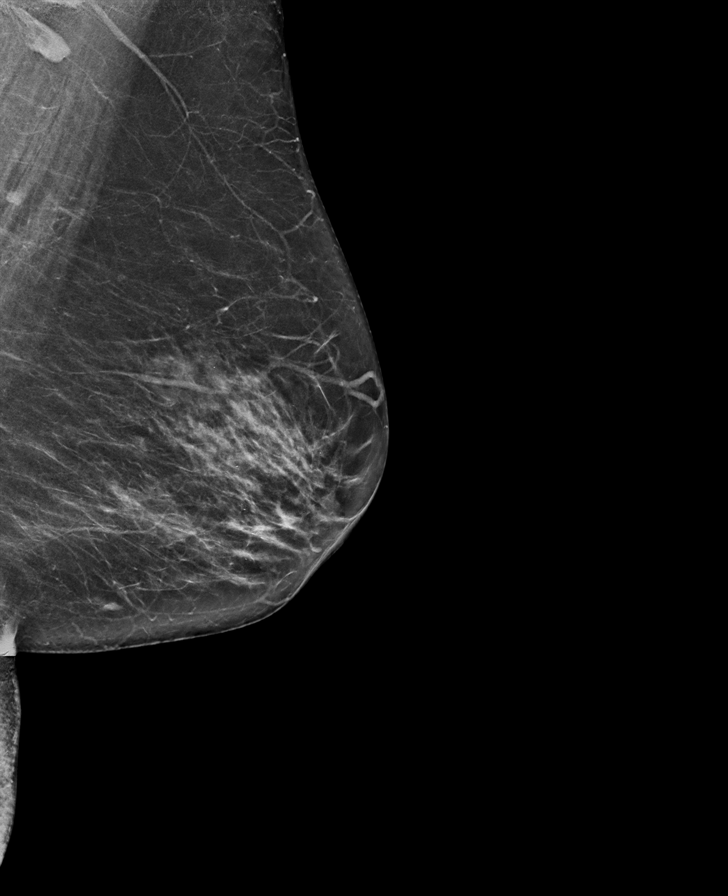

[R CC synth-2D]
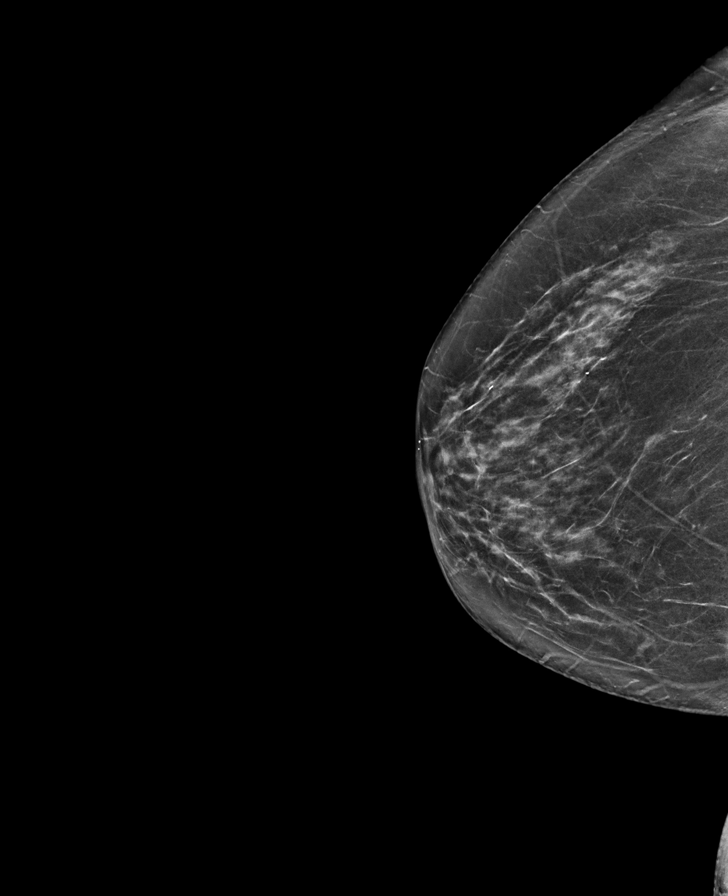

[L CC synth-2D]
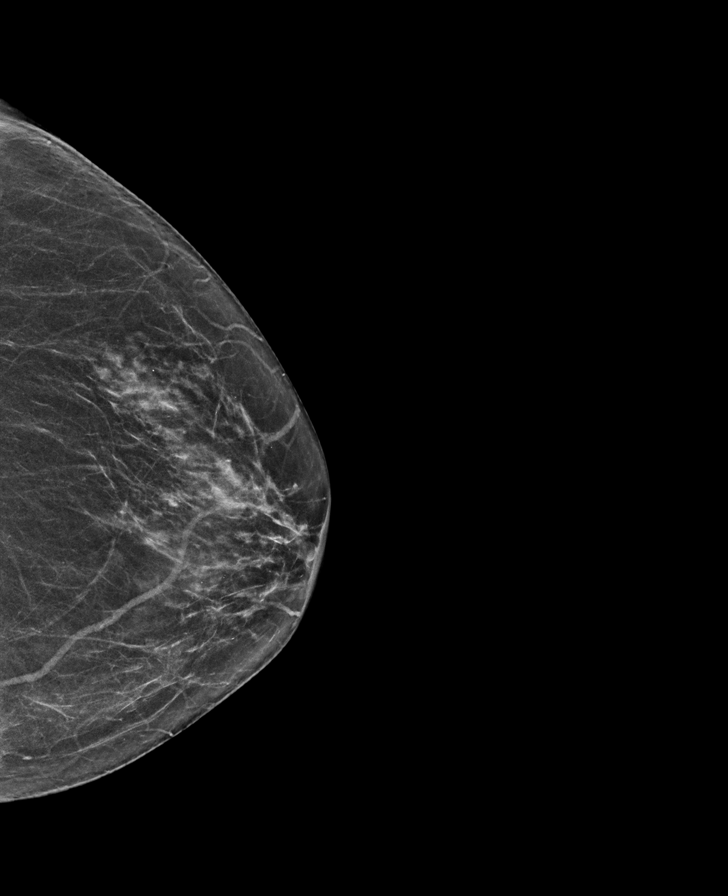

[R MLO synth-2D]
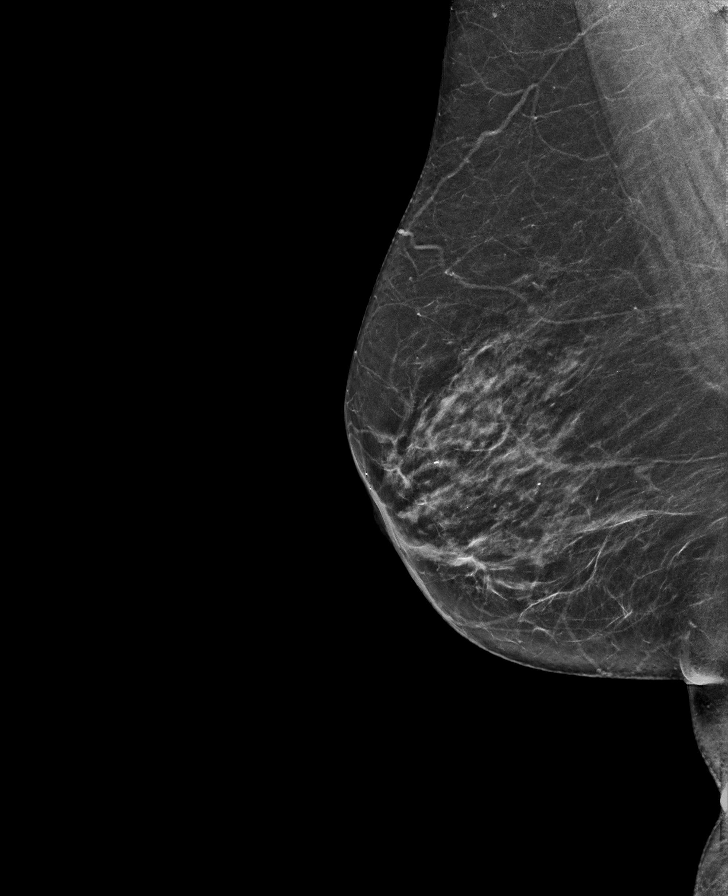

[R MLO tomo · tomo slice 34/67.0]
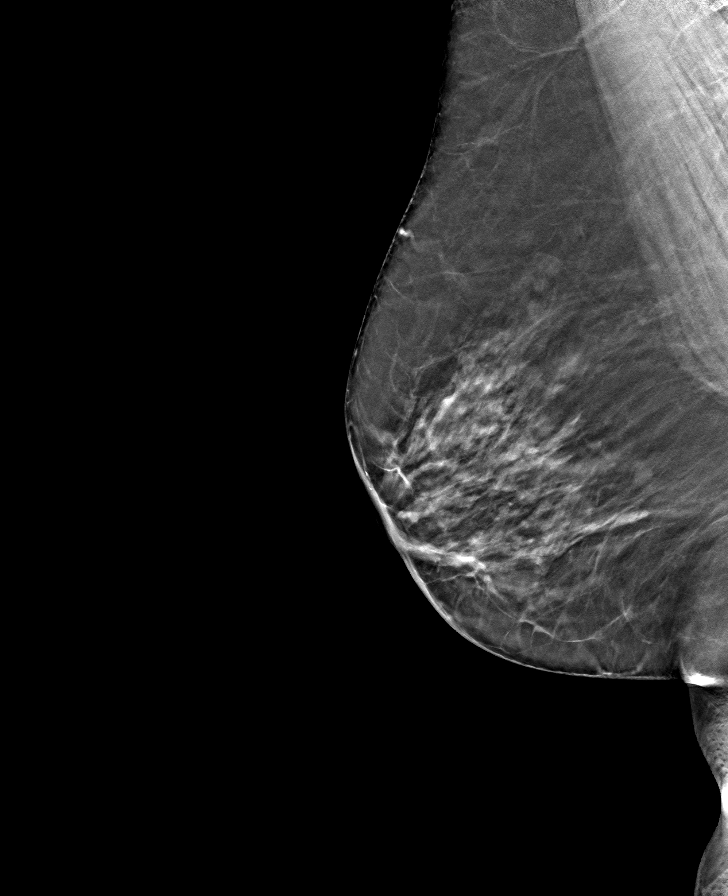

[R CC tomo · tomo slice 33/66.0]
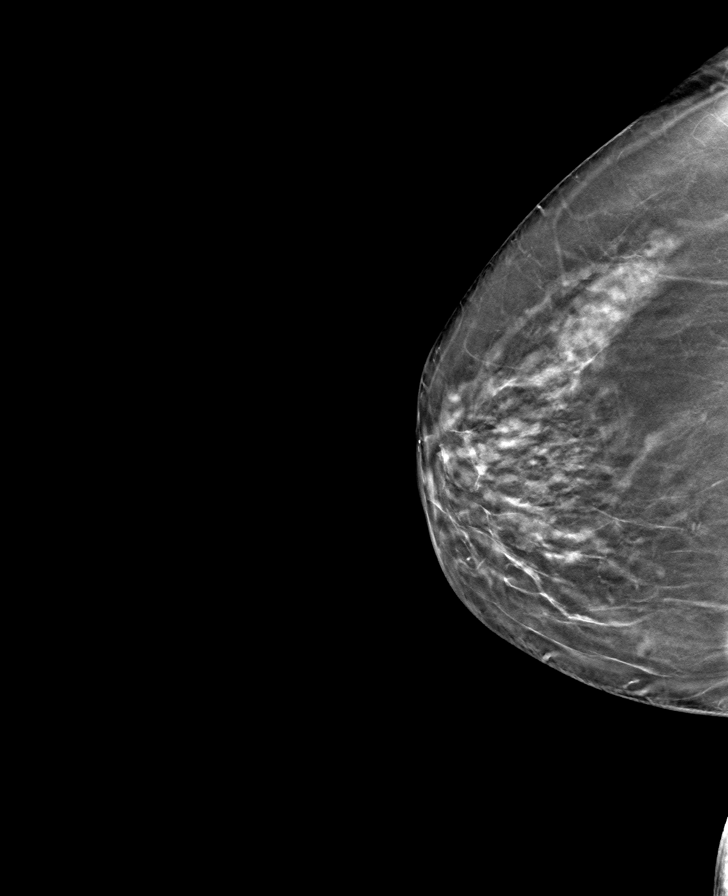

[L MLO tomo · tomo slice 35/70.0]
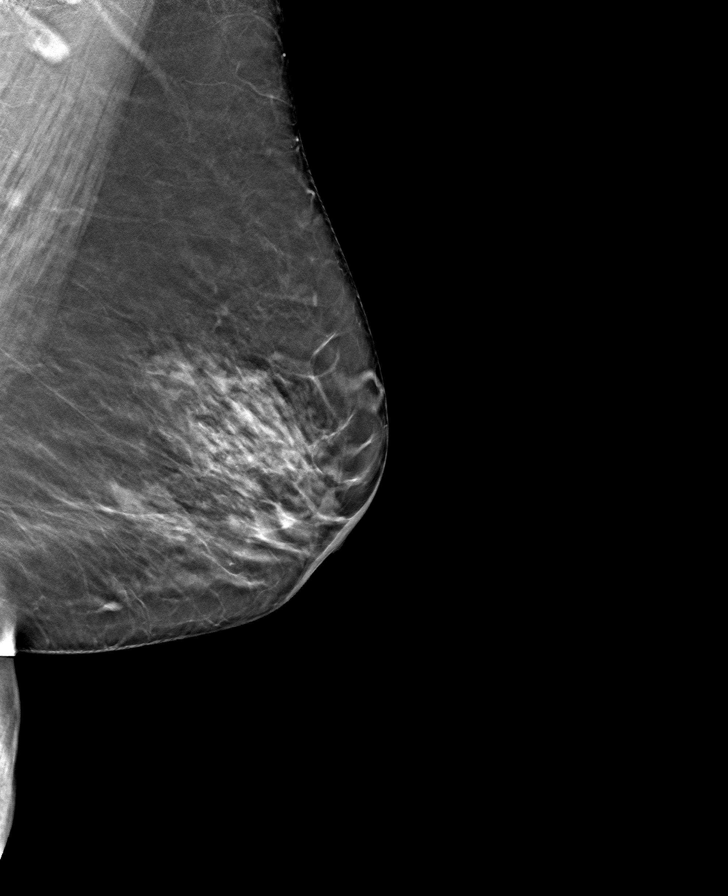

[L CC tomo · tomo slice 31/61.0]
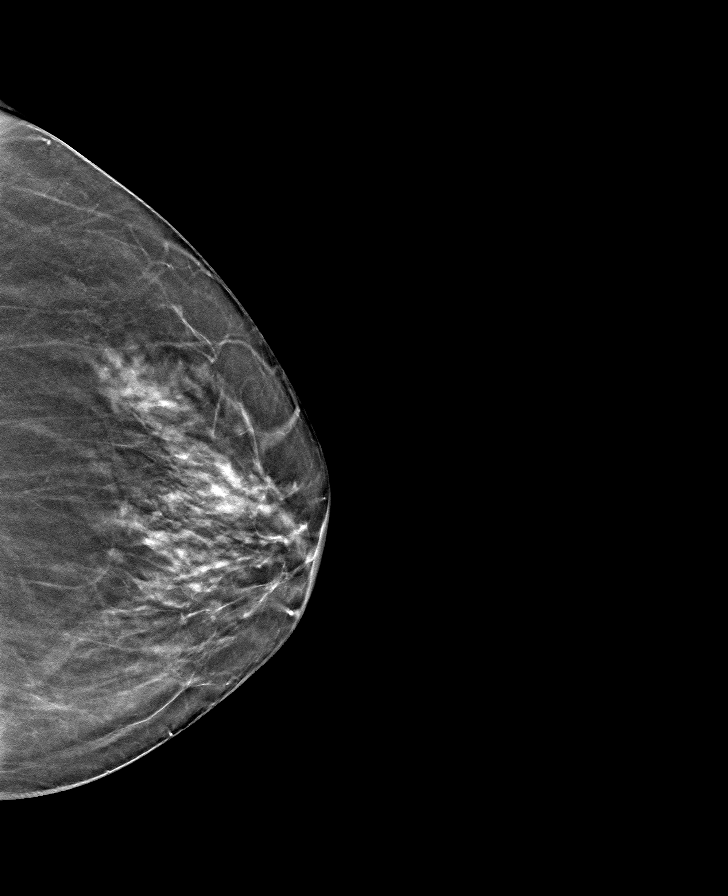

[8 of 24 positions shown; findings below may reference images not displayed]

ACR Breast Density Category c: The breast tissue is heterogeneously
dense, which may obscure small masses.
FINDINGS: There are no findings suspicious for malignancy.
IMPRESSION: No mammographic evidence of malignancy. A result letter of this
screening mammogram will be mailed directly to the patient.

RECOMMENDATION:
Screening mammogram in one year. (Code:Q3-W-BC3)

BI-RADS CATEGORY  1: Negative.

## 2023-05-02 ENCOUNTER — Other Ambulatory Visit: Payer: Self-pay | Admitting: Family Medicine

## 2023-05-02 DIAGNOSIS — Z1231 Encounter for screening mammogram for malignant neoplasm of breast: Secondary | ICD-10-CM

## 2023-07-03 ENCOUNTER — Ambulatory Visit
Admission: RE | Admit: 2023-07-03 | Discharge: 2023-07-03 | Disposition: A | Discharge status: Home / Self Care | Payer: Medicare HMO | Source: Ambulatory Visit | Attending: Family Medicine | Admitting: Family Medicine

## 2023-07-03 DIAGNOSIS — Z1231 Encounter for screening mammogram for malignant neoplasm of breast: Secondary | ICD-10-CM | POA: Diagnosis present

## 2023-08-15 DIAGNOSIS — N898 Other specified noninflammatory disorders of vagina: Secondary | ICD-10-CM | POA: Diagnosis not present

## 2023-08-15 DIAGNOSIS — Z4689 Encounter for fitting and adjustment of other specified devices: Secondary | ICD-10-CM | POA: Diagnosis not present

## 2023-10-31 DIAGNOSIS — Z008 Encounter for other general examination: Secondary | ICD-10-CM | POA: Diagnosis not present

## 2023-10-31 DIAGNOSIS — Z6825 Body mass index (BMI) 25.0-25.9, adult: Secondary | ICD-10-CM | POA: Diagnosis not present

## 2023-10-31 DIAGNOSIS — E663 Overweight: Secondary | ICD-10-CM | POA: Diagnosis not present

## 2023-11-14 DIAGNOSIS — Z4689 Encounter for fitting and adjustment of other specified devices: Secondary | ICD-10-CM | POA: Diagnosis not present

## 2023-11-14 DIAGNOSIS — N8111 Cystocele, midline: Secondary | ICD-10-CM | POA: Diagnosis not present

## 2023-11-14 DIAGNOSIS — L929 Granulomatous disorder of the skin and subcutaneous tissue, unspecified: Secondary | ICD-10-CM | POA: Diagnosis not present

## 2024-02-13 DIAGNOSIS — Z4689 Encounter for fitting and adjustment of other specified devices: Secondary | ICD-10-CM | POA: Diagnosis not present

## 2024-02-13 DIAGNOSIS — L929 Granulomatous disorder of the skin and subcutaneous tissue, unspecified: Secondary | ICD-10-CM | POA: Diagnosis not present

## 2024-02-13 DIAGNOSIS — N8111 Cystocele, midline: Secondary | ICD-10-CM | POA: Diagnosis not present

## 2024-05-15 DIAGNOSIS — E042 Nontoxic multinodular goiter: Secondary | ICD-10-CM | POA: Diagnosis not present

## 2024-05-15 DIAGNOSIS — Z0000 Encounter for general adult medical examination without abnormal findings: Secondary | ICD-10-CM | POA: Diagnosis not present

## 2024-05-23 ENCOUNTER — Other Ambulatory Visit: Payer: Self-pay | Admitting: Family Medicine

## 2024-05-23 DIAGNOSIS — Z1231 Encounter for screening mammogram for malignant neoplasm of breast: Secondary | ICD-10-CM

## 2024-07-09 ENCOUNTER — Ambulatory Visit
Admission: RE | Admit: 2024-07-09 | Discharge: 2024-07-09 | Disposition: A | Source: Ambulatory Visit | Attending: Family Medicine | Admitting: Family Medicine

## 2024-07-09 DIAGNOSIS — Z1231 Encounter for screening mammogram for malignant neoplasm of breast: Secondary | ICD-10-CM
